# Patient Record
Sex: Female | Born: 2000 | Hispanic: Yes | Marital: Single | Smoking: Never smoker
Health system: Southern US, Community
[De-identification: ages and names within clinical notes are randomized; demographics above are authoritative.]

## PROBLEM LIST (undated history)

## (undated) HISTORY — PX: GANGLION CYST EXCISION: SHX1691

## (undated) HISTORY — PX: SKIN GRAFT: SHX250

---
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
## 2022-03-30 ENCOUNTER — Other Ambulatory Visit: Payer: Self-pay

## 2022-03-30 ENCOUNTER — Emergency Department
Admission: EM | Admit: 2022-03-30 | Discharge: 2022-03-30 | Disposition: A | Discharge status: Home / Self Care | Payer: Medicaid Other | Attending: Emergency Medicine | Admitting: Emergency Medicine

## 2022-03-30 DIAGNOSIS — F419 Anxiety disorder, unspecified: Secondary | ICD-10-CM | POA: Diagnosis present

## 2022-03-30 MED ORDER — HYDROXYZINE HCL 10 MG PO TABS: 10.0000 mg | ORAL_TABLET | Freq: Three times a day (TID) | ORAL | 0 refills | Status: AC | PRN

## 2022-03-30 NOTE — ED Provider Notes (Signed)
? ?[facility] ?Provider Note ? ?Patient Contact: 7:58 PM (approximate) ? ? ?History  ? ?Anxiety ? ? ?HPI ? ?Debra Henson is a 21 y.o. female presents to the emergency department after patient reports that she was driving her car and suddenly felt tearful and her throat felt tight.  Patient reports that she had just finished a phone conversation with her mom and mom was discussing stressful events that have occurred recently.  Patient states that she oftentimes struggles to solve family problems.  Patient denies a history of panic attacks but states that she does sometimes feel anxious.  She is about to start a second job and goes to school full-time.  She denies chest pain, chest tightness or shortness of breath and states that she has felt better since waiting in the emergency department. ? ?  ? ? ?Physical Exam  ? ?Triage Vital Signs: ?ED Triage Vitals  ?Enc Vitals Group  ?   BP 03/30/22 1849 120/82  ?   Pulse Rate 03/30/22 1849 72  ?   Resp 03/30/22 1849 16  ?   Temp 03/30/22 1849 98.3 ?F (36.8 ?C)  ?   Temp Source 03/30/22 1849 Oral  ?   SpO2 03/30/22 1849 99 %  ?   Weight 03/30/22 1814 175 lb (79.4 kg)  ?   Height 03/30/22 1814 5\' 7"  (1.702 m)  ?   Head Circumference --   ?   Peak Flow --   ?   Pain Score 03/30/22 1814 0  ?   Pain Loc --   ?   Pain Edu? --   ?   Excl. in GC? --   ? ? ?Most recent vital signs: ?Vitals:  ? 03/30/22 1849  ?BP: 120/82  ?Pulse: 72  ?Resp: 16  ?Temp: 98.3 ?F (36.8 ?C)  ?SpO2: 99%  ? ? ? ?General: Alert and in no acute distress. ?Eyes:  PERRL. EOMI. ?Head: No acute traumatic findings ?ENT: ?     Nose: No congestion/rhinnorhea. ?     Mouth/Throat: Mucous membranes are moist. ?Neck: No stridor. No cervical spine tenderness to palpation. ?Cardiovascular:  Good peripheral perfusion ?Respiratory: Normal respiratory effort without tachypnea or retractions. Lungs CTAB. Good air entry to the bases with no decreased or absent breath  sounds. ?Gastrointestinal: Bowel sounds ?4 quadrants. Soft and nontender to palpation. No guarding or rigidity. No palpable masses. No distention. No CVA tenderness. ?Musculoskeletal: Full range of motion to all extremities.  ?Neurologic:  No gross focal neurologic deficits are appreciated.  ?Skin:   No rash noted ?Other: ? ? ?ED Results / Procedures / Treatments  ? ?Labs ?(all labs ordered are listed, but only abnormal results are displayed) ?Labs Reviewed - No data to display ? ? ? ?PROCEDURES: ? ?Critical Care performed: No ? ?Procedures ? ? ?MEDICATIONS ORDERED IN ED: ?Medications - No data to display ? ? ?IMPRESSION / MDM / ASSESSMENT AND PLAN / ED COURSE  ?I reviewed the triage vital signs and the nursing notes. ?             ?               ? ?Assessment and plan ?Anxiety ?21 year old female presents to the emergency department after a possible panic attack. ? ?Vital signs are reassuring at triage.  On physical exam, patient was alert, active and nontoxic-appearing.  She reported that her symptoms had resolved while waiting in the emergency department.  We spent time discussing  stress management techniques at home such as exercise, sleep hygiene and frequent small meals throughout the day.  Patient was cautioned that should her panic attacks increase in frequency, she should seek care with her primary care provider to discuss other anxiety management options. ? ? ?FINAL CLINICAL IMPRESSION(S) / ED DIAGNOSES  ? ?Final diagnoses:  ?Anxiety  ? ? ? ?Rx / DC Orders  ? ?ED Discharge Orders   ? ?      Ordered  ?  hydrOXYzine (ATARAX) 10 MG tablet  3 times daily PRN       ? 03/30/22 1905  ? ?  ?  ? ?  ? ? ? ?Note:  This document was prepared using Dragon voice recognition software and may include unintentional dictation errors. ?  ?Orvil Feil, PA-C ?03/30/22 2006 ? ?  ?Merwyn Katos, MD ?03/30/22 2009 ? ?

## 2022-03-30 NOTE — ED Notes (Signed)
See triage note    presents with possible panic attack  states she became very anxious going to work  tearful on arrival    ?

## 2022-03-30 NOTE — Discharge Instructions (Addendum)
You can take hydroxyzine up to 3 times daily. 

## 2022-03-30 NOTE — ED Triage Notes (Signed)
Patient to ER via [facility] with complaints of anxiety, denies hx of the same. Reports driving to work when she had a panic attack. Tearful in triage.  ? ?States she has a lot going on in her personal life but states this is normal and she is not sure why she is so anxious today. Denies SI/ HI.  ?

## 2022-03-30 NOTE — ED Notes (Signed)
Pt d/c home per MD order. Discharge summary reviewed, pt verbalizes understanding. Ambulatory off unit. No s/s of acute distress noted, reports parents are discharge ride home.  ?

## 2022-08-05 ENCOUNTER — Encounter: Payer: Self-pay | Admitting: Advanced Practice Midwife

## 2022-09-06 ENCOUNTER — Encounter: Payer: Medicaid Other | Admitting: Licensed Practical Nurse

## 2022-09-14 ENCOUNTER — Encounter: Payer: Medicaid Other | Admitting: Obstetrics & Gynecology

## 2022-09-16 ENCOUNTER — Encounter: Payer: Self-pay | Admitting: Obstetrics & Gynecology

## 2023-05-03 ENCOUNTER — Encounter: Payer: Self-pay | Admitting: Family Medicine

## 2023-05-03 ENCOUNTER — Ambulatory Visit: Payer: Medicaid Other

## 2023-05-03 ENCOUNTER — Ambulatory Visit (LOCAL_COMMUNITY_HEALTH_CENTER): Payer: Medicaid Other | Admitting: Family Medicine

## 2023-05-03 VITALS — BP 104/57 | Wt 190.4 lb

## 2023-05-03 DIAGNOSIS — Z113 Encounter for screening for infections with a predominantly sexual mode of transmission: Secondary | ICD-10-CM

## 2023-05-03 DIAGNOSIS — A749 Chlamydial infection, unspecified: Secondary | ICD-10-CM

## 2023-05-03 DIAGNOSIS — Z30430 Encounter for insertion of intrauterine contraceptive device: Secondary | ICD-10-CM

## 2023-05-03 DIAGNOSIS — Z3200 Encounter for pregnancy test, result unknown: Secondary | ICD-10-CM

## 2023-05-03 DIAGNOSIS — Z3202 Encounter for pregnancy test, result negative: Secondary | ICD-10-CM | POA: Diagnosis not present

## 2023-05-03 DIAGNOSIS — Z309 Encounter for contraceptive management, unspecified: Secondary | ICD-10-CM | POA: Diagnosis not present

## 2023-05-03 DIAGNOSIS — E663 Overweight: Secondary | ICD-10-CM | POA: Insufficient documentation

## 2023-05-03 DIAGNOSIS — A549 Gonococcal infection, unspecified: Secondary | ICD-10-CM

## 2023-05-03 DIAGNOSIS — Z01419 Encounter for gynecological examination (general) (routine) without abnormal findings: Secondary | ICD-10-CM

## 2023-05-03 LAB — WET PREP FOR TRICH, YEAST, CLUE
Trichomonas Exam: NEGATIVE
Yeast Exam: NEGATIVE

## 2023-05-03 LAB — HM HIV SCREENING LAB: HM HIV Screening: NEGATIVE

## 2023-05-03 LAB — PREGNANCY, URINE: Preg Test, Ur: NEGATIVE

## 2023-05-03 MED ORDER — CEFTRIAXONE SODIUM 500 MG IJ SOLR
500.0000 mg | Freq: Once | INTRAMUSCULAR | Status: AC
  Administered 2023-05-03: 500 mg via INTRAMUSCULAR

## 2023-05-03 MED ORDER — DOXYCYCLINE HYCLATE 100 MG PO TABS: 100.0000 mg | ORAL_TABLET | Freq: Two times a day (BID) | ORAL | 0 refills | Status: AC

## 2023-05-03 MED ORDER — PARAGARD INTRAUTERINE COPPER IU IUD
1.0000 | INTRAUTERINE_SYSTEM | Freq: Once | INTRAUTERINE | Status: AC
  Administered 2023-05-03: 1 via INTRAUTERINE

## 2023-05-03 NOTE — Progress Notes (Signed)
[facility] DEPARTMENT [facility] [address] Main Number: [phone]  Family Planning Visit- Initial Visit  Subjective:  Debra Henson is a 22 y.o.  G0P0000   being seen today for an initial annual visit and to discuss reproductive life planning.  The patient is currently using Female Condom for pregnancy prevention. Patient reports   does not want a pregnancy in the next year.     report they are looking for a method that provides High efficacy at preventing pregnancy  Patient has the following medical conditions has Overweight (BMI 25.0-29.9) and Intractable migraine without aura and without status migrainosus on their problem list.  Chief Complaint  Patient presents with   Annual Exam    PE/IUD insert     Patient reports to clinic for PE and IUD insertion.   Body mass index is 29.82 kg/m. - Patient is eligible for diabetes screening based on BMI> 25 and age >35?  no HA1C ordered? not applicable  Patient reports 1  partner/s in last year. Desires STI screening?  Yes  Has patient been screened once for HCV in the past?  No  No results found for: "HCVAB"  Does the patient have current drug use (including MJ), have a partner with drug use, and/or has been incarcerated since last result? No  If yes-- Screen for HCV through [facility]   Does the patient meet criteria for HBV testing? No  Criteria:  -Household, sexual or needle sharing contact with HBV -History of drug use -HIV positive -Those with known Hep C   Health Maintenance Due  Topic Date Due   CHLAMYDIA SCREENING  Never done   HIV Screening  Never done   Hepatitis C Screening  Never done   COVID-19 Vaccine (3 - 2023-24 season) 08/27/2022    Review of Systems  Constitutional:  Negative for weight loss.  Eyes:  Negative for blurred vision.  Respiratory:  Negative for cough and shortness of breath.   Cardiovascular:  Negative for claudication.   Gastrointestinal:  Negative for nausea.  Genitourinary:  Positive for frequency. Negative for dysuria.  Skin:  Negative for rash.  Neurological:  Positive for headaches.  Endo/Heme/Allergies:  Does not bruise/bleed easily.  Psychiatric/Behavioral:  Positive for depression.     The following portions of the patient's history were reviewed and updated as appropriate: allergies, current medications, past family history, past medical history, past social history, past surgical history and problem list. Problem list updated.   See flowsheet for other program required questions.  Objective:   Vitals:   05/03/23 1345  BP: (!) 104/57  Weight: 190 lb 6.4 oz (86.4 kg)    Physical Exam Vitals and nursing note reviewed.  Constitutional:      Appearance: Normal appearance.  HENT:     Head: Normocephalic and atraumatic.     Mouth/Throat:     Mouth: Mucous membranes are moist.     Pharynx: Oropharynx is clear. No oropharyngeal exudate or posterior oropharyngeal erythema.  Pulmonary:     Effort: Pulmonary effort is normal.  Abdominal:     Palpations: Abdomen is soft. There is no mass.     Tenderness: There is no abdominal tenderness. There is no rebound.  Genitourinary:    General: Normal vulva.     Exam position: Lithotomy position.     Pubic Area: No rash or pubic lice.      Labia:        Right: No rash  or lesion.        Left: No rash or lesion.      Vagina: Vaginal discharge present. No erythema, bleeding or lesions.     Cervix: Discharge and erythema present. No cervical motion tenderness, friability or lesion.     Uterus: Normal.      Adnexa: Right adnexa normal and left adnexa normal.     Rectum: Normal.     Comments: pH = 6-7  Copious amts of thick yellow- green discharge in vaginal canal Lymphadenopathy:     Head:     Right side of head: No preauricular or posterior auricular adenopathy.     Left side of head: No preauricular or posterior auricular adenopathy.      Cervical: No cervical adenopathy.     Upper Body:     Right upper body: No supraclavicular, axillary or epitrochlear adenopathy.     Left upper body: No supraclavicular, axillary or epitrochlear adenopathy.     Lower Body: No right inguinal adenopathy. No left inguinal adenopathy.  Skin:    General: Skin is warm and dry.     Findings: No rash.  Neurological:     Mental Status: She is alert and oriented to person, place, and time.    Assessment and Plan:  Debra Henson is a 22 y.o. female presenting to the [facility] Department for an initial annual wellness/contraceptive visit  1. Well woman exam with routine gynecological exam -Pap test not indicated until 21 per ASCCP guidelines  -CBE not indicated until 25 per ACOG guidelines -PHQ-9 score of 10 today, patient declined referral to LCSW, stating she feels "normal" and that this is due to stress, denies SI -reports continued HA, not currently on meds -reports occasional swelling in feet  Contraception counseling: Reviewed options based on patient desire and reproductive life plan. Patient is interested in IUD or IUS. This was provided to the patient today.   Risks, benefits, and typical effectiveness rates were reviewed.  Questions were answered.  Written information was also given to the patient to review.    The patient will follow up in  1 years for surveillance.  The patient was told to call with any further questions, or with any concerns about this method of contraception.  Emphasized use of condoms 100% of the time for STI prevention.  Need for ECP was assessed. Patient reported > 120 hours .   2. Overweight (BMI 25.0-29.9) Patient reports weight gain of 20# in the last year  3. Encounter for IUD insertion Patient presented to [facility] for IUD insertion. Her GC/CT screening was found to be up to date and using WHO criteria we can be reasonably certain she is not pregnant or a pregnancy test was obtained  which was Urine pregnancy test  today was Negative.  See Flowsheet for IUD check list  IUD Insertion Procedure Note Patient identified, informed consent performed, consent signed.   Discussed risks of irregular bleeding, cramping, infection, malpositioning or misplacement of the IUD outside the uterus which may require further procedure such as laparoscopy. Time out was performed.    Speculum placed in the vagina.  Cervix visualized.  Cleaned with Betadine x 2.  Grasped posteriorly with a single tooth tenaculum.  Uterus sounded to 7 cm.  IUD placed per manufacturer's recommendations.  Strings trimmed to 3 cm. Tenaculum was removed, good hemostasis noted.  Patient tolerated procedure well.   Patient was given post-procedure instructions- both agency handout and verbally by provider.  She was advised to have backup contraception for one week.  Patient was also asked to check IUD strings periodically or follow up in 4 weeks for IUD check.  4. Screening for venereal disease Reports that she was positive for Chlamydia in 2023, and since then doesn't "feel right". States that she had severe abdominal pain about 1 month ago, and has had thick discharge and frequent urination. Reports unprotected sex. On exam- copious thick, yellow- green discharge with a pH of 7 -Wet prep negative  -discussed tx for GC/Chlamydia now, and insertion of IUD today vs treatment and waiting the IUD. Also offered to wait for results to come back and delay IUD placement -reviewed risk/benefit of each with patient- and patient chose to have: Presumptive treatment today for GC and Chlamydia and have the IUD placed today  - Chlamydia/Gonorrhea [facility] - WET PREP FOR TRICH, YEAST, CLUE - HIV [facility] - Syphilis Serology, [facility] - Gonococcus culture  5. Encounter for pregnancy test, result unknown PT negative today  - Pregnancy, urine  6. Gonorrhea - cefTRIAXone (ROCEPHIN) injection 500 mg  7.  Chlamydia Doxy given per standing order   Return in about 4 weeks (around 05/31/2023) for annual well-woman exam, IUD string check.  No future appointments.  Lenice Llamas, [place]

## 2023-05-03 NOTE — Progress Notes (Signed)
Pt appointment for PE and IUD placement. Seen by FNP Sydnee Levans. Doxycycline dispensed and Ceftriaxone administered per provider in order to treat any potential Chlamydia or Gonorrhea infections prior to IUD insertion, since provider observed possible symptoms. Education provided on both medications. Provider placed Paragard, RN assisted. Pt tolerated well.

## 2023-05-04 NOTE — Addendum Note (Signed)
Addended by: Lenice Llamas on: 05/04/2023 02:01 PM   Modules accepted: Orders

## 2023-09-19 ENCOUNTER — Other Ambulatory Visit: Payer: Self-pay

## 2023-09-19 ENCOUNTER — Ambulatory Visit: Payer: Medicaid Other | Admitting: Family Medicine

## 2023-09-19 ENCOUNTER — Encounter: Payer: Self-pay | Admitting: Family Medicine

## 2023-09-19 VITALS — BP 128/84 | HR 64 | Ht 67.0 in | Wt 186.6 lb

## 2023-09-19 DIAGNOSIS — L989 Disorder of the skin and subcutaneous tissue, unspecified: Secondary | ICD-10-CM

## 2023-09-19 DIAGNOSIS — Z113 Encounter for screening for infections with a predominantly sexual mode of transmission: Secondary | ICD-10-CM

## 2023-09-19 NOTE — Progress Notes (Unsigned)
Pt is here for IUD and STD check. Wet mount results reviewed, no treatment required per SO.  Berdie Ogren, RN

## 2023-09-20 LAB — WET PREP FOR TRICH, YEAST, CLUE
Trichomonas Exam: NEGATIVE
Yeast Exam: NEGATIVE

## 2023-09-20 NOTE — Progress Notes (Signed)
[facility] DEPARTMENT [facility] [address] Main Number: [phone]  Family Planning Visit- Repeat Yearly Visit  Subjective:  Debra Henson is a 22 y.o. G0P0000  being seen today for a f/u visit to check IUD strings and have STI testing. The patient is currently using IUD or IUS for pregnancy prevention. Patient does not want a pregnancy in the next year.   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  Chief Complaint  Patient presents with   Contraception    IUD check and September    Patient reports that she is happy with the IUD and has noticed some increase in menstrual flow and cramping with her monthly cycle, but this does not interfere with her ADLs. She does not self-check IUD strings. Patient reports one female partner in the last 2 months and having vaginal and oral sex.  She endorses an increase in yellowish discharge that has no odor. She cannot remember onset.   Patient denies additional symptoms. She denies using douching products.    See flowsheet for other program required questions.   Body mass index is 29.23 kg/m. - Patient is eligible for diabetes screening based on BMI> 25 and age >35?  no HA1C ordered? not applicable  Patient reports 1 of partners in last year. Desires STI screening?  Yes   Has patient been screened once for HCV in the past?  Yes  No results found for: "HCVAB"  Does the patient have current of drug use, have a partner with drug use, and/or has been incarcerated since last result? No  If yes-- Screen for HCV through [facility]   Does the patient meet criteria for HBV testing? No  Criteria:  -Household, sexual or needle sharing contact with HBV -History of drug use -HIV positive -Those with known Hep C   Health Maintenance Due  Topic Date Due   CHLAMYDIA  SCREENING  Never done   Hepatitis C Screening  Never done   INFLUENZA VACCINE  Never done   COVID-19 Vaccine (3 - 2023-24 season) 08/28/2023    Review of Systems  Constitutional: Negative.   HENT: Negative.    Eyes: Negative.   Respiratory: Negative.    Cardiovascular: Negative.   Gastrointestinal: Negative.   Genitourinary:        Increased yellowish vaginal discharge with no odor.   Musculoskeletal: Negative.   Skin:  Positive for rash.       Reports skin changes to the base of a finger on each hand and left wrist area.   Neurological: Negative.   Endo/Heme/Allergies: Negative.   Psychiatric/Behavioral: Negative.      The following portions of the patient's history were reviewed and updated as appropriate: allergies, current medications, past family history, past medical history, past social history, past surgical history and problem list. Problem list updated.  Objective:   Vitals:   09/19/23 1549  BP: 128/84  Pulse: 64  Weight: 186 lb 9.6 oz (84.6 kg)  Height: 5\' 7"  (1.702 m)    Physical Exam Vitals and nursing note reviewed. Exam conducted with a chaperone present Cameron Proud, RN present for PE as chaperone.).  Constitutional:      Appearance: Normal appearance.  HENT:     Head: Normocephalic.     Salivary Glands: Right salivary gland is not diffusely enlarged or tender. Left salivary gland is not diffusely enlarged  or tender.     Mouth/Throat:     Lips: Pink.     Mouth: Mucous membranes are moist.     Tongue: No lesions.     Pharynx: Oropharynx is clear. Uvula midline. No oropharyngeal exudate or posterior oropharyngeal erythema.     Tonsils: No tonsillar exudate.  Eyes:     General:        Right eye: No discharge.        Left eye: No discharge.  Pulmonary:     Effort: Pulmonary effort is normal.  Genitourinary:    General: Normal vulva.     Exam position: Lithotomy position.     Pubic Area: No rash or pubic lice.      Tanner stage (genital): 5.      Labia:        Right: No rash, tenderness or lesion.        Left: No rash, tenderness or lesion.      Vagina: Normal.     Cervix: Discharge present. No cervical motion tenderness, friability, lesion, erythema or cervical bleeding.     Uterus: Normal.      Adnexa: Right adnexa normal and left adnexa normal.     Comments: pH<4.5 Small amount of yellow vaginal discharge present.  White IUD strings visualized from cervical os.  Musculoskeletal:        General: Normal range of motion.  Lymphadenopathy:     Head:     Right side of head: No submental, submandibular, tonsillar, preauricular or posterior auricular adenopathy.     Left side of head: No submental, submandibular, tonsillar, preauricular or posterior auricular adenopathy.     Cervical: No cervical adenopathy.     Right cervical: No superficial or posterior cervical adenopathy.    Left cervical: No superficial or posterior cervical adenopathy.     Upper Body:     Right upper body: No supraclavicular or axillary adenopathy.     Left upper body: No supraclavicular or axillary adenopathy.     Lower Body: No right inguinal adenopathy. No left inguinal adenopathy.  Skin:    General: Skin is warm and dry.          Comments: Healing atopic dermatitis appearing rash to top of left wrist.  Erythema present to base of finger on the right hand where ring makes contact.  Patient reports these areas have been present for a few months and she has seen the doctor about them.  Neurological:     Mental Status: She is alert. Mental status is at baseline.  Psychiatric:        Attention and Perception: Attention normal.        Mood and Affect: Mood normal.        Speech: Speech normal.        Behavior: Behavior normal. Behavior is cooperative.        Thought Content: Thought content normal.     Assessment and Plan:  Debra Henson is a 22 y.o. female G0P0000 presenting to the [facility] Department for an yearly  wellness and contraception visit   Contraception counseling: Reviewed options based on patient desire and reproductive life plan. Patient is interested in IUD or IUS. This was not provided to the patient today because patient has IUD in place since May 2024.   Risks, benefits, and typical effectiveness rates were reviewed.  Questions were answered.  Written information was also given to the patient to review.    The patient  will follow up in  1 years for surveillance.  The patient was told to call with any further questions, or with any concerns about this method of contraception.  Emphasized use of condoms 100% of the time for STI prevention.  1. Screening for venereal disease  - WET PREP FOR TRICH, YEAST, CLUE - Gonococcus culture - Chlamydia/Gonorrhea [facility]  Treat per Wet Prep standing order.   2. Skin abnormality See PE regarding rash to finger and wrist. Patient advised if these area continue to bother her and do not further improve she should seek care with a PCP or dermatologist. A list of PCPs were provided today.   Return if symptoms worsen or fail to improve.  No future appointments.  Edmonia James, NP

## 2023-09-24 LAB — GONOCOCCUS CULTURE
